# Patient Record
Sex: Male | Born: 1990 | Race: White | Hispanic: No | Marital: Single | Smoking: Current every day smoker
Health system: Southern US, Community
[De-identification: ages and names within clinical notes are randomized; demographics above are authoritative.]

## PROBLEM LIST (undated history)

## (undated) DIAGNOSIS — F909 Attention-deficit hyperactivity disorder, unspecified type: Secondary | ICD-10-CM

## (undated) DIAGNOSIS — B1920 Unspecified viral hepatitis C without hepatic coma: Secondary | ICD-10-CM

## (undated) DIAGNOSIS — F32A Depression, unspecified: Secondary | ICD-10-CM

## (undated) DIAGNOSIS — F329 Major depressive disorder, single episode, unspecified: Secondary | ICD-10-CM

## (undated) DIAGNOSIS — F419 Anxiety disorder, unspecified: Secondary | ICD-10-CM

## (undated) HISTORY — DX: Unspecified viral hepatitis C without hepatic coma: B19.20

## (undated) HISTORY — PX: SHOULDER ARTHROSCOPY: SHX128

## (undated) HISTORY — DX: Attention-deficit hyperactivity disorder, unspecified type: F90.9

## (undated) HISTORY — PX: KNEE ARTHROSCOPY: SHX127

## (undated) HISTORY — DX: Anxiety disorder, unspecified: F41.9

---
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
## 2015-06-10 ENCOUNTER — Emergency Department (HOSPITAL_COMMUNITY)
Admission: EM | Admit: 2015-06-10 | Discharge: 2015-06-10 | Disposition: A | Discharge status: Home / Self Care | Payer: BC Managed Care – PPO | Attending: Emergency Medicine | Admitting: Emergency Medicine

## 2015-06-10 ENCOUNTER — Encounter (HOSPITAL_COMMUNITY): Payer: Self-pay | Admitting: Emergency Medicine

## 2015-06-10 DIAGNOSIS — F17200 Nicotine dependence, unspecified, uncomplicated: Secondary | ICD-10-CM | POA: Insufficient documentation

## 2015-06-10 DIAGNOSIS — F329 Major depressive disorder, single episode, unspecified: Secondary | ICD-10-CM | POA: Insufficient documentation

## 2015-06-10 DIAGNOSIS — F1110 Opioid abuse, uncomplicated: Secondary | ICD-10-CM | POA: Diagnosis present

## 2015-06-10 DIAGNOSIS — F919 Conduct disorder, unspecified: Secondary | ICD-10-CM | POA: Insufficient documentation

## 2015-06-10 DIAGNOSIS — Z79899 Other long term (current) drug therapy: Secondary | ICD-10-CM | POA: Insufficient documentation

## 2015-06-10 DIAGNOSIS — F419 Anxiety disorder, unspecified: Secondary | ICD-10-CM | POA: Insufficient documentation

## 2015-06-10 DIAGNOSIS — Z7982 Long term (current) use of aspirin: Secondary | ICD-10-CM | POA: Insufficient documentation

## 2015-06-10 HISTORY — DX: Major depressive disorder, single episode, unspecified: F32.9

## 2015-06-10 HISTORY — DX: Depression, unspecified: F32.A

## 2015-06-10 MED ORDER — IBUPROFEN 200 MG PO TABS: 400.0000 mg | ORAL_TABLET | Freq: Four times a day (QID) | ORAL | Status: AC | PRN

## 2015-06-10 MED ORDER — ONDANSETRON HCL 4 MG PO TABS: 4.0000 mg | ORAL_TABLET | Freq: Four times a day (QID) | ORAL | Status: DC

## 2015-06-10 MED ORDER — CLONIDINE HCL 0.1 MG PO TABS: 0.1000 mg | ORAL_TABLET | Freq: Four times a day (QID) | ORAL | Status: DC | PRN

## 2015-06-10 NOTE — ED Provider Notes (Signed)
CSN: [id number]     Arrival date & time 06/10/15  1847 History  By signing my name below, I, Ronald Huang, attest that this documentation has been prepared under the direction and in the presence of Antony Madura, PA-C Electronically Signed: Soijett Huang, ED Scribe. 06/10/2015. 9:31 PM.   Chief Complaint  Patient presents with  . Addiction Problem    The history is provided by the patient. No language interpreter was used.    HPI Comments: Ronald Huang is a 24 y.o. male with a PMHx of depression and anxiety, who presents to the Emergency Department complaining of addiction problem. 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 He notes that 1.5 years ago he went to a [facility] rehab center in [place] and he was 11 months clean and he had a good job until he relapsed x 3 months ago. Pt left [facility] 5 days ago and drove cross country to [place], [place] returning yesterday. He notes that he left [facility] due to being homesick and wanting to get high. Pt reports that his last heroin use was 4-5 hours ago and it relieved his withdrawal symptoms that he was having. He notes that he feels like his depression and anxiety symptoms are worsening and he uses heroin to self-medicate. He reports that when he was taking adderrall and klonopin his anxiety and depression was controlled. Pt He states that he is having associated symptoms of mild nausea and diaphoresis.  He denies SI/HI, vomiting, diarrhea, and any other symptoms.   Past Medical History  Diagnosis Date  . Depression    Past Surgical History  Procedure Laterality Date  . Shoulder arthroscopy    . Knee arthroscopy     No family history on file. Social History  Substance Use Topics  . Smoking status: Current Every Day Smoker -- 1.00 packs/day  . Smokeless tobacco: None  . Alcohol Use: No    Review of  Systems  Psychiatric/Behavioral: Positive for behavioral problems.       +substance abuse  All other systems reviewed and are negative.   Allergies  Review of patient's allergies indicates no known allergies.  Home Medications   Prior to Admission medications   Medication Sig Start Date End Date Taking? Authorizing Provider  acetaminophen (TYLENOL) 325 MG tablet Take 650 mg by mouth every 6 (six) hours as needed (for pain).   Yes Historical Provider, MD  aspirin (BAYER ASPIRIN EC LOW DOSE) 81 MG EC tablet Take 162 mg by mouth every 6 (six) hours as needed for pain. Swallow whole.   Yes Historical Provider, MD  levETIRAcetam (KEPPRA) 500 MG tablet Take 500 mg by mouth 2 (two) times daily. 06/02/15  Yes Historical Provider, MD  buprenorphine (SUBUTEX) 2 MG SUBL SL tablet Place 2 mg under the tongue every 4 (four) hours. For withdrawal symptoms 06/04/15   Historical Provider, MD  cloNIDine (CATAPRES) 0.1 MG tablet Take 1 tablet (0.1 mg total) by mouth every 6 (six) hours as needed (For withdrawal symptoms). 06/10/15   Antony Madura, PA-C  dicyclomine (BENTYL) 20 MG tablet Take 20 mg by mouth every 4 (four) hours as needed (For withdrawal symptoms).  06/01/15   Historical Provider, MD  ibuprofen (ADVIL,MOTRIN) 200 MG tablet Take 2 tablets (400 mg total) by mouth every 6 (six) hours as needed (for pain.). 06/10/15   Antony Madura, PA-C  methocarbamol (ROBAXIN) 750 MG tablet Take 750 mg by mouth 3 (three) times daily as needed (For withdrawal symptoms).  06/01/15   Historical Provider, MD  ondansetron (ZOFRAN) 4 MG tablet Take 1 tablet (4 mg total) by mouth every 6 (six) hours. Take as needed for nausea 06/10/15   Antony Madura, PA-C  PHENObarbital (LUMINAL) 60 MG tablet Take 60 mg by mouth 4 (four) times daily. 06/01/15   Historical Provider, MD  traZODone (DESYREL) 100 MG tablet Take 100 mg by mouth at bedtime and may repeat dose one time if needed. 06/03/15   Historical Provider, MD   BP 116/81 mmHg   Pulse 106  Temp(Src) 98.1 F (36.7 C) (Oral)  Resp 15  Ht  (1.778 m)  Wt 69.4 kg  BMI 21.95 kg/m2  SpO2 100%   Physical Exam  Constitutional: He is oriented to person, place, and time. He appears well-developed and well-nourished. No distress.  Nontoxic/nonseptic appearing  HENT:  Head: Normocephalic and atraumatic.  Eyes: Conjunctivae and EOM are normal. No scleral icterus.  Neck: Normal range of motion.  Pulmonary/Chest: Effort normal. No respiratory distress.  Musculoskeletal: Normal range of motion.  Neurological: He is alert and oriented to person, place, and time. He exhibits normal muscle tone. Coordination normal.  Skin: Skin is warm and dry. No rash noted. He is not diaphoretic. No erythema. No pallor.  Psychiatric: His speech is normal and behavior is normal. He exhibits a depressed mood. He expresses no homicidal and no suicidal ideation.  Patient tearful  Nursing note and vitals reviewed.   ED Course  Procedures (including critical care time) DIAGNOSTIC STUDIES: Oxygen Saturation is 100% on RA, nl by my interpretation.    COORDINATION OF CARE: 9:31 PM Discussed treatment plan with pt at bedside which includes TTS consult and pt agreed to plan.  Labs Review Labs Reviewed - No data to display  Imaging Review No results found.    EKG Interpretation None      MDM   Final diagnoses:  Heroin abuse    24 year old male presents to the emergency department requesting detox from IV heroin. Patient denies suicidal and homicidal thoughts. He has been evaluated by psychiatry who believe the patient is stable for outpatient detox at this time. No indication for inpatient psychiatric care. Patient has been given resource guide. Mother and grandmother of the patient expressed frustration over the patient not being placed in a detox facility. I explained that we do not offer inpatient detox and, while detox from opiates is very unpleasant, it is not  life-threatening to detox from heroin. I have offered medications to manage withdrawal symptoms; however, mother and grandmother as well as the patient remained frustrated with the process. Patient states "I don't want to stick a needle in my arm, but you know that what I'm going to go home and do". Patient was consoled by grandmother and mother was becoming very emotional. Mother stated, "It's hard but you can do it. We can do it. And if something happens to you I'll sue like hell". Family and patient then promptly got up and left the department without discharge papers or prescriptions.  I personally performed the services described in this documentation, which was scribed in my presence. The recorded information has been reviewed and is accurate.    Filed Vitals:   06/10/15 1906  BP: 116/81  Pulse: 106  Temp: 98.1 F (36.7 C)  TempSrc: Oral  Resp: 15  Height:  (1.778 m)  Weight:  69.4 kg  SpO2: 100%       Antony MaduraKelly Matina Rodier, PA-C 06/10/15 2302  Lavera Guiseana Duo Liu, MD 06/11/15 [phone]

## 2015-06-10 NOTE — BH Assessment (Signed)
Tele Assessment Note   Ronald Huang is a 24 y.o. male who voluntarily [facility].  Pt denies SI/HI/AVH. Pt reports the following: He was admitted to [facility] in [place] for rehab and checked himself out and started using.  Pt checked himself out of rehab 4 days ago and has been using for the past 4 days.  Pt brought to the hospital by mother/grandmother requesting help with seeking rehab/detox.  Pt c/o w/d sxs: sweats, stomach cramps, and anxiety.  Pt says his doc is heroin, he uses 3 grams everyday and his last use was 4 hrs ago.  Pt also uses 1/10 of a gram of cocaine and his last use was 4 days ago; he uses a varied amount of methamphetamines, last use was 1 month ago and he takes 10mg  of xanax and last used 1 wk ago.  Pt states that he has seizure activity with benzodiazepine use.  This Clinical research associatewriter discussed disposition with Antony MaduraKelly Humes, PA who d/c pt with outpatient referrals.   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 Depression     Past Surgical History  Procedure Laterality Date  . Shoulder arthroscopy    . Knee arthroscopy      Family History: No family history on file.  Social History:  reports that he has been smoking.  He does not have any smokeless tobacco history on file. He reports that he uses illicit drugs (IV, Cocaine, Marijuana, and Methamphetamines). He reports that he does not drink alcohol.  Additional Social History:  Alcohol / Drug Use Pain Medications: See MAR  Prescriptions: See MAR  Over the Counter: See MAR  History of alcohol / drug use?: Yes Longest period of sobriety (when/how long): Only during inpt rehab  Negative Consequences of Use: Personal relationships, Work / School Withdrawal Symptoms:  Cramps, Sweats, Other (Comment) (Anxiety ) Substance #1 Name of Substance 1: Heroin  1 - Age of First Use: 17 YOM  1 - Amount (size/oz): 3 Gram  1 - Frequency: Daily  1 - Duration: 4 Days  1 - Last Use / Amount: 06/10/15 Substance #2 Name of Substance 2: Cocaine--Powder  2 - Age of First Use: Teens  2 - Amount (size/oz): 1/10 of a Gram  2 - Frequency: Daily  2 - Duration: On-going  2 - Last Use / Amount: 4 Days Ago  Substance #3 Name of Substance 3: Methamphetamine 3 - Age of First Use: Teens  3 - Amount (size/oz): Varies  3 - Frequency: Wkly  3 - Duration: On-going  3 - Last Use / Amount: 1 Month Ago  Substance #4 Name of Substance 4: Xanax  4 - Age of First Use: Teens  4 - Amount (size/oz): 10MG  4 - Frequency: Daily  4 - Duration: On-going  4 - Last Use / Amount: 1 Wk Ago   CIWA: CIWA-Ar BP: 116/81 mmHg Pulse Rate: 106 COWS: Clinical Opiate Withdrawal Scale (COWS) Resting Pulse Rate: Pulse Rate 101-120 Sweating: Subjective report of chills or flushing Restlessness: Frequent shifting or extraneous movements of legs/arms Pupil Size: Pupils possibly larger than normal for room light Bone or Joint Aches: Mild diffuse discomfort Runny Nose or Tearing: Nose constantly running or tears streaming down cheeks GI Upset: Stomach cramps Tremor: Gross tremor or muscle twitching Yawning:  Yawning once or twice during assessment Anxiety or Irritability: Patient obviously irritable/anxious Gooseflesh Skin: Skin is smooth COWS Total Score: 20  PATIENT STRENGTHS: (choose at least two) Supportive family/friends  Allergies: No Known Allergies  Home Medications:  (Not in a hospital admission)  OB/GYN Status:  No LMP for male patient.  General Assessment Data Location of Assessment: [facility] ED TTS Assessment: In system Is this a Tele or Face-to-Face Assessment?: Face-to-Face Is this an Initial Assessment or a Re-assessment for this encounter?: Initial Assessment Marital status:  Single Maiden name: None  Is patient pregnant?: No Pregnancy Status: No Living Arrangements: Parent (Lives with parents ) Can pt return to current living arrangement?: Yes Admission Status: Voluntary Is patient capable of signing voluntary admission?: Yes Referral Source: MD Insurance type: BCBS  Medical Screening Exam [facility] Walk-in ONLY) Medical Exam completed: No Reason for MSE not completed: Other: (None )  Crisis Care Plan Living Arrangements: Parent (Lives with parents ) Name of Psychiatrist: None  Name of Therapist: None   Education Status Is patient currently in school?: No Current Grade: None  Highest grade of school patient has completed: None  Name of school: None  Contact person: None   Risk to self with the past 6 months Suicidal Ideation: No Has patient been a risk to self within the past 6 months prior to admission? : No Suicidal Intent: No Has patient had any suicidal intent within the past 6 months prior to admission? : No Is patient at risk for suicide?: No Suicidal Plan?: No Has patient had any suicidal plan within the past 6 months prior to admission? : No Access to Means: No What has been your use of drugs/alcohol within the last 12 months?: Abusing: heroin, cocaine, methamphetamines, xanax Previous Attempts/Gestures: No How many times?: 0 Other Self Harm Risks: None  Triggers for Past Attempts: None known Intentional Self Injurious Behavior: None Family Suicide History: No Recent stressful life event(s): Other (Comment) (Chronic SA ) Persecutory voices/beliefs?: No Depression: No Depression Symptoms:  (None reported ) Substance abuse history and/or treatment for substance abuse?: Yes Suicide prevention information given to non-admitted patients: Not applicable  Risk to Others within the past 6 months Homicidal Ideation: No Does patient have any lifetime risk of violence toward others beyond the six months prior to admission? : No Thoughts of  Harm to Others: No Current Homicidal Intent: No Current Homicidal Plan: No Access to Homicidal Means: No Identified Victim: None  History of harm to others?: No Assessment of Violence: None Noted Violent Behavior Description: None  Does patient have access to weapons?: No Criminal Charges Pending?: No Does patient have a court date: No Is patient on probation?: No  Psychosis Hallucinations: None noted Delusions: None noted  Mental Status Report Appearance/Hygiene: Disheveled Eye Contact: Fair Motor Activity: Unremarkable Speech: Logical/coherent Level of Consciousness: Alert Mood: Other (Comment) (Appropriate ) Affect: Appropriate to circumstance Anxiety Level: Minimal Thought Processes: Relevant, Coherent Judgement: Unimpaired Orientation: Person, Place, Time, Situation Obsessive Compulsive Thoughts/Behaviors: None  Cognitive Functioning Concentration: Normal Memory: Recent Intact, Remote Intact IQ: Average Insight: Fair Impulse Control: Poor Appetite: Fair Weight Loss: 0 Weight Gain: 0 Sleep: Decreased Total Hours of Sleep: 4 Vegetative Symptoms: None  ADLScreening [facility] Assessment Services) Patient's cognitive ability adequate to safely complete daily activities?: Yes Patient able to express need for assistance with ADLs?: Yes Independently performs ADLs?: Yes (appropriate for developmental age)  Prior Inpatient Therapy Prior Inpatient Therapy: Yes Prior Therapy Dates: 2016  Prior Therapy Facilty/Provider(s): [facility]  Reason for Treatment: Rehab  Prior Outpatient Therapy Prior Outpatient Therapy: No Prior Therapy Dates: None  Prior Therapy Facilty/Provider(s): None  Reason for Treatment: None  Does patient have an ACCT team?: No Does patient have Intensive In-House Services?  : No Does patient have [facility] services? : No Does patient have [facility] services?: No  ADL Screening (condition at time of admission) Patient's cognitive ability  adequate to safely complete daily activities?: Yes Is the patient deaf or have difficulty hearing?: No Does the patient have difficulty seeing, even when wearing glasses/contacts?: No Does the patient have difficulty concentrating, remembering, or making decisions?: No Patient able to express need for assistance with ADLs?: Yes Does the patient have difficulty dressing or bathing?: No Independently performs ADLs?: Yes (appropriate for developmental age) Does the patient have difficulty walking or climbing stairs?: No Weakness of Legs: None Weakness of Arms/Hands: None  Home Assistive Devices/Equipment Home Assistive Devices/Equipment: None  Therapy Consults (therapy consults require a physician order) PT Evaluation Needed: No OT Evalulation Needed: No SLP Evaluation Needed: No Abuse/Neglect Assessment (Assessment to be complete while patient is alone) Physical Abuse: Denies Verbal Abuse: Denies Sexual Abuse: Denies Exploitation of patient/patient's resources: Denies Self-Neglect: Denies Values / Beliefs Cultural Requests During Hospitalization: None Spiritual Requests During Hospitalization: None Consults Spiritual Care Consult Needed: No Social Work Consult Needed: No Merchant navy officer (For Healthcare) Does patient have an advance directive?: No Would patient like information on creating an advanced directive?: No - patient declined information    Additional Information 1:1 In Past 12 Months?: No CIRT Risk: No Elopement Risk: No Does patient have medical clearance?: Yes     Disposition:  Disposition Initial Assessment Completed for this Encounter: Yes Disposition of Patient: Referred to (Per [facility] pt was given referrals and d/c ) Patient referred to: Other (Comment) (Per [facility] pt given referrals and d/c )  Murrell Redden 06/10/2015 10:47 PM

## 2015-06-10 NOTE — ED Notes (Signed)
PA attempted to speak with patient and family regarding resources and seeking residential treatment. Family visualized walking out crying. Did not stay for resources/prescriptions/AVS.

## 2015-06-10 NOTE — Discharge Instructions (Signed)
Opioid Use Disorder °Opioid use disorder is a mental disorder. It is the continued nonmedical use of opioids in spite of risks to health and well-being. Misused opioids include the street drug heroin. They also include pain medicines such as morphine, hydrocodone, oxycodone, and fentanyl. Opioids are very addictive. People who misuse opioids get an exaggerated feeling of well-being. Opioid use disorder often disrupts activities at home, work, or school. It may cause mental or physical problems.  °A family history of opioid use disorder puts you at higher risk of it. People with opioid use disorder often misuse other drugs or have mental illness such as depression, posttraumatic stress disorder, or antisocial personality disorder. They also are at risk of suicide and death from overdose. °SIGNS AND SYMPTOMS  °Signs and symptoms of opioid use disorder include: °· Use of opioids in larger amounts or over a longer period than intended. °· Unsuccessful attempts to cut down or control opioid use. °· A lot of time spent obtaining, using, or recovering from the effects of opioids. °· A strong desire or urge to use opioids (craving). °· Continued use of opioids in spite of major problems at work, school, or home because of use. °· Continued use of opioids in spite of relationship problems because of use. °· Giving up or cutting down on important life activities because of opioid use. °· Use of opioids over and over in situations when it is physically hazardous, such as driving a car. °· Continued use of opioids in spite of a physical problem that is likely related to use. Physical problems can include: °¨ Severe constipation. °¨ Poor nutrition. °¨ Infertility. °¨ Tuberculosis. °¨ Aspiration pneumonia. °¨ Infections such as human immunodeficiency virus (HIV) and hepatitis (from injecting opioids). °· Continued use of opioids in spite of a mental problem that is likely related to use. Mental problems can  include: °¨ Depression. °¨ Anxiety. °¨ Hallucinations. °¨ Sleep problems. °¨ Loss of sexual function. °· Need to use more and more opioids to get the same effect, or lessened effect over time with use of the same amount (tolerance). °· Having withdrawal symptoms when opioid use is stopped, or using opioids to reduce or avoid withdrawal symptoms. Withdrawal symptoms include: °¨ Depressed, anxious, or irritable mood. °¨ Nausea, vomiting, diarrhea, or intestinal cramping. °¨ Muscle aches or spasms. °¨ Excessive tearing or runny nose. °¨ Dilated pupils, sweating, or hairs standing on end. °¨ Yawning. °¨ Fever, raised blood pressure, or fast pulse. °¨ Restlessness or trouble sleeping. This does not apply to people taking opioids for medical reasons only. °DIAGNOSIS °Opioid use disorder is diagnosed by your health care provider. You may be asked questions about your opioid use and and how it affects your life. A physical exam may be done. A drug screen may be ordered. You may be referred to a mental health professional. The diagnosis of opioid use disorder requires at least two symptoms within 12 months. The type of opioid use disorder you have depends on the number of signs and symptoms you have. The type may be: °· Mild. Two or three signs and symptoms.    °· Moderate. Four or five signs and symptoms.   °· Severe. Six or more signs and symptoms. °TREATMENT  °Treatment is usually provided by mental health professionals with training in substance use disorders. The following options are available: °· Detoxification. This is the first step in treatment for withdrawal. It is medically supervised withdrawal with the use of medicines. These medicines lessen withdrawal symptoms. They also raise the chance   of becoming opioid free. °· Counseling, also known as talk therapy. Talk therapy addresses the reasons you use opioids. It also addresses ways to keep you from using again (relapse). The goals of talk therapy are to avoid  relapse by: °¨ Identifying and avoiding triggers for use. °¨ Finding healthy ways to cope with stress. °¨ Learning how to handle cravings. °· Support groups. Support groups provide emotional support, advice, and guidance. °· A medicine that blocks opioid receptors in your brain. This medicine can reduce opioid cravings that lead to relapse. This medicine also blocks the desired opioid effect when relapse occurs. °· Opioids that are taken by mouth in place of the misused opioid (opioid maintenance treatment). These medicines satisfy cravings but are safer than commonly misused opioids. This often is the best option for people who continue to relapse with other treatments. °HOME CARE INSTRUCTIONS  °· Take medicines only as directed by your health care provider. °· Check with your health care provider before starting new medicines. °· Keep all follow-up visits as directed by your health care provider. °SEEK MEDICAL CARE IF: °· You are not able to take your medicines as directed. °· Your symptoms get worse. °SEEK IMMEDIATE MEDICAL CARE IF: °· You have serious thoughts about hurting yourself or others. °· You may have taken an overdose of opioids. °FOR MORE INFORMATION °· National Institute on Drug Abuse: www.drugabuse.gov °· Substance Abuse and Mental Health Services Administration: www.samhsa.gov °  °This information is not intended to replace advice given to you by your health care provider. Make sure you discuss any questions you have with your health care provider. °  °Document Released: 04/29/2007 Document Revised: 07/23/2014 Document Reviewed: 07/15/2013 °Elsevier Interactive Patient Education ©2016 Elsevier Inc. ° ° °Emergency Department Resource Guide °1) Find a Doctor and Pay Out of Pocket °Although you won't have to find out who is covered by your insurance plan, it is a good idea to ask around and get recommendations. You will then need to call the office and see if the doctor you have chosen will accept you  as a new patient and what types of options they offer for patients who are self-pay. Some doctors offer discounts or will set up payment plans for their patients who do not have insurance, but you will need to ask so you aren't surprised when you get to your appointment. ° °2) Contact Your Local Health Department °Not all health departments have doctors that can see patients for sick visits, but many do, so it is worth a call to see if yours does. If you don't know where your local health department is, you can check in your phone book. The CDC also has a tool to help you locate your state's health department, and many state websites also have listings of all of their local health departments. ° °3) Find a Walk-in Clinic °If your illness is not likely to be very severe or complicated, you may want to try a walk in clinic. These are popping up all over the country in pharmacies, drugstores, and shopping centers. They're usually staffed by nurse practitioners or physician assistants that have been trained to treat common illnesses and complaints. They're usually fairly quick and inexpensive. However, if you have serious medical issues or chronic medical problems, these are probably not your best option. ° °No Primary Care Doctor: °- Call [facility] at  832-8000 - they can help you locate a primary care doctor that  accepts your insurance, provides certain services, etc. °-   Physician Referral Service- 1-800-533-3463 ° °Chronic Pain Problems: °Organization         Address  Phone   Notes  °[facility]  (336) 297-2271 Patients need to be referred by their primary care doctor.  ° °Medication Assistance: °Organization         Address  Phone   Notes  °[place] [facility] [address] °[place], [place] [address] (336) 641-8030 --Must be a resident of [place] °-- Must have NO insurance coverage whatsoever (no Medicaid/ Medicare, etc.) °-- The pt. MUST have  a primary care doctor that directs their care regularly and follows them in the community °  °[facility]  (866) 331-1348   °[employer]  (888) 892-1162   ° °Agencies that provide inexpensive medical care: °Organization         Address  Phone   Notes  °[facility]  (336) 832-8035   °[facility]    (336) 832-7272   °[facility] [address] °[place], [place] [address] (336) 832-4777   °[facility] [address], °[place] (336) 271-4999   °[facility]    (336) 373-0678   °[facility]    (336) 272-1050   °[facility] ° [address], [place] Phone:  (336) 832-4444, Fax:  (336) 832-4440 Hours of Operation:  9 am - 6 pm, M-F.  Also accepts Medicaid/Medicare and self-pay.  °[facility] ° [address], [place] Phone: (336) 832-3150, Fax: (336) 832-3151. Hours of Operation:  8:30 am - 5:30 pm, M-F.  Also accepts Medicaid and self-pay.  °[facility] [place] [address], [place] Phone: (336) 878-6027   °[facility] [address], [place], [place] (336)723-1848, Ext. 123 Mondays & Thursdays: 7-9 AM.  First 15 patients are seen on a first come, first serve basis. °  ° °Medicaid-accepting [place] Providers: ° °Organization         Address  Phone   Notes  °[facility] [address], [place] (336) 641-2100 Also accepts self-pay patients.  °[facility] [address], [place] ° (336) 856-9996   °[facility] [address], [place] (336) 288-8857   °[facility] [address] [place] [address], [place] (336) 299-7000   °Veita Bland [address], [place]  ° (336) 373-1557 Only accepts [place] Access Medicaid patients after they have their name applied to their card.  ° °Self-Pay (no insurance) in [place]: ° °Organization         Address  Phone   Notes  °Sickle Cell Patients, [facility] [address], [place] (336) 832-1970   °[facility] [address], [place] (336) 832-4400   °[facility] ° [address], [place] (336) 992-4800   °[facility]/Dr. Osei-Bonsu ° [address] [place] [address], [place] or [address], [place] (336) [phone] Phone number for both [place] and [place] locations is the same.  °[facility] [address], [place] (336) 299-0000   °[facility] [address] [place] [address],  or [address] (336) 852-7530 °(336) 878-2260   °[facility] [address],  (336) 350-1642,   phone; (336) 294-5005, fax Sees patients 1st and 3rd Saturday of every month.  Must not qualify for public or private insurance (i.e. Medicaid, Medicare, [place] Health Choice, Veterans' Benefits) • Household income should be no more than 200% of the poverty level •The clinic cannot treat you if you are pregnant or think you are pregnant • Sexually transmitted diseases are not treated at the clinic.  ° ° °Dental Care: °Organization         Address  Phone  Notes  °[facility] [facility] [address], [place] (336) 641-6152 Accepts children up to age 21 who are enrolled in Medicaid or [place] Health Choice; pregnant women with a Medicaid card; and children who have applied for Medicaid or [place] Health Choice, but were declined, whose parents can pay a reduced fee at time of service.  °[facility] [place]  [address], [place] (336) 641-7733 Accepts children up to age 21 who are enrolled in Medicaid or [place] Health Choice; pregnant women with a Medicaid card; and children who have applied for Medicaid or [place] Health Choice, but were declined, whose parents can  pay a reduced fee at time of service.  °[facility] ° [address], [place] (336) 641-4533 Patients are seen by appointment only. Walk-ins are not accepted. [facility] will see patients 18 years of age and older. °Monday - Tuesday (8am-5pm) °Most Wednesdays (8:30-5pm) °$30 per visit, cash only  °[facility] ° [address], [place] (336) 641-4533 Patients are seen by appointment only. Walk-ins are not accepted. [facility] will see patients 18 years of age and older. °One Wednesday Evening (Monthly: Volunteer Based).  $30 per visit, cash only  °[facility]  (919) 537-3737 for adults; Children under age 4, call Graduate Pediatric Dentistry at ([phone]) [phone]. Children aged 4-14, please call (919) [phone] to request a pediatric application. ° Dental services are provided in all areas of dental care including fillings, crowns and bridges, complete and partial dentures, implants, gum treatment, root canals, and extractions. Preventive care is also provided. Treatment is provided to both adults and children. °Patients are selected via a lottery and there is often a waiting list. °  °[facility] [address], °[place] ° (336) 763-8833 www.drcivils.com °  °[facility] [address], [place], [place] (336)723-1848, Ext. 123 Second and Fourth Thursday of each month, opens at 6:30 AM; Clinic ends at 9 AM.  Patients are seen on a first-come first-served basis, and a limited number are seen during each clinic.  ° °[facility] ° [address], [place], [place] (336) 723-7904   Eligibility Requirements °You must have lived in [place], [place], or [place] counties for at least the last three months. °  You cannot be eligible for state or federal sponsored healthcare insurance, including Veterans Administration, Medicaid, or Medicare. °  You generally cannot be eligible for healthcare  insurance through your employer.  °  How to apply: °Eligibility screenings are held every Tuesday and Wednesday afternoon from 1:00 pm until 4:00 pm. You do not need an appointment for the interview!  °[facility] [address], [place],  336-631-2330   °[facility]  336-342-8273   °[facility]  336-703-3100   °[facility]  336-570-6415   ° °Behavioral Health Resources in   the Community: °Intensive Outpatient Programs °Organization         Address  Phone  Notes  °[facility] [address], [place], [place] 336-878-6098   °[facility] [address], [place], [place] 336-832-9800   °[facility] [address], [place], [place] ° 336-882-2125   °[facility] [address],  °[place], [place] 1-800-853-5163 or 336-641-4981   °Substance Abuse Resources °Organization         Address  Phone  Notes  °[facility]  336-882-2125   °[facility]  336-784-9470   °[facility]  336-285-9073   °[facility]  336-845-3988   °Residential & Outpatient Substance Abuse Program  1-800-659-3381   °Psychological Services °Organization         Address  Phone  Notes  ° Health  336- 832-9600   °[facility]  336- 378-7881   °[facility] [address], [place] 1-800-853-5163 or 336-641-4981   ° °Mobile Crisis Teams °Organization         Address  Phone  Notes  °[facility], Mobile Crisis Care Unit  1-877-626-1772   °Assertive °[facility] ° [address] [place], [place] 336-834-9664   °Sharon DeEsch [address] °[place] 336-554-5454   ° °Self-Help/Support Groups °Organization         Address  Phone             Notes  °[facility] [place] - variety of support groups  336- 373-1402 Call for more information  °Narcotics Anonymous (NA),  [facility] [address], °[place]  2 meetings at this location  ° °Residential Treatment Programs °Organization         Address  Phone  Notes  °[facility] [address],    °[place]  1-866-801-8205   °[facility] ° [address], [place], [place] 704-293-8524   °[facility] [address], [place] 336-845-3988 Admissions: 8am-3pm M-F  °[facility] [address]    °[place], [place] 336-841-1104   °[facility] [address], [place], [place] 336-379-7146   °[facility] [address]  °[place], [place] 336-285-9073   °[facility] - Intensive Outpatient [address], [place], [place] 336-852-3033   °[facility] [address]  °[place], [place] 1-877-615-2722 or 336-784-9470   °[facility] [address] [place], [place] 336-227-7417 Accepts Medicaid  °[facility] [address]  °[place] 1-800-659-3381 Substance Abuse/Addiction Treatment  ° °[place] Behavioral Health Resources °Organization         Address  Phone  Notes  °[facility]  (888) 581-9988   °Julie Brannon, PhD [address] [place], [place]   (336) 349-5553 or (336) 951-0000   °[facility]   [address] °[place], [place] (336) 349-4454   °[facility] [address], [place], [place] (336) 342-8316 Insurance/Medicaid/sponsorship through [facility]  °[facility] [address]                                    [place], [place] (336) 342-8316 Therapy/tele-psych/case  °[facility] [address]  ° [place],  (336) 349-2233    °  Dr. Arfeen  (336) 349-4544   °[facility] [place]  [employer] [place] [facility] 1) [address], [place] °2) [address], [place] °3)  [address], [place] (336) 349-3220 °(336) 342-7768 ° °(336) 342-8140    °[place] [facility] (336) 342-1394 or (336) 342-3537 (After Hours)    ° ° ° °

## 2015-06-10 NOTE — ED Notes (Signed)
Pt comes to Ed, w c/o of withdrawal symptoms hx of IV drug use for 6 years, opiates, has hx of depression and anxiety.  Pt verbalizes a wish for help and counseling, and displays and willingness for rehab if necessary.

## 2015-08-08 ENCOUNTER — Ambulatory Visit (INDEPENDENT_AMBULATORY_CARE_PROVIDER_SITE_OTHER): Payer: BLUE CROSS/BLUE SHIELD | Admitting: Physician Assistant

## 2015-08-08 VITALS — BP 120/86 | HR 72 | Temp 98.4°F | Resp 16 | Ht 69.25 in | Wt 169.2 lb

## 2015-08-08 DIAGNOSIS — R369 Urethral discharge, unspecified: Secondary | ICD-10-CM | POA: Diagnosis not present

## 2015-08-08 DIAGNOSIS — R300 Dysuria: Secondary | ICD-10-CM | POA: Diagnosis not present

## 2015-08-08 DIAGNOSIS — F1990 Other psychoactive substance use, unspecified, uncomplicated: Secondary | ICD-10-CM

## 2015-08-08 DIAGNOSIS — Z113 Encounter for screening for infections with a predominantly sexual mode of transmission: Secondary | ICD-10-CM | POA: Diagnosis not present

## 2015-08-08 LAB — POC MICROSCOPIC URINALYSIS (UMFC): MUCUS RE: ABSENT

## 2015-08-08 LAB — POCT URINALYSIS DIP (MANUAL ENTRY)
BILIRUBIN UA: NEGATIVE
Glucose, UA: NEGATIVE
Leukocytes, UA: NEGATIVE
Nitrite, UA: NEGATIVE
PH UA: 6
Protein Ur, POC: NEGATIVE
RBC UA: NEGATIVE
Spec Grav, UA: 1.025
Urobilinogen, UA: 2

## 2015-08-08 MED ORDER — CEFTRIAXONE SODIUM 250 MG IJ SOLR
250.0000 mg | Freq: Once | INTRAMUSCULAR | Status: AC
  Administered 2015-08-08: 250 mg via INTRAMUSCULAR

## 2015-08-08 MED ORDER — AZITHROMYCIN 250 MG PO TABS: ORAL_TABLET | ORAL | Status: AC

## 2015-08-08 NOTE — Progress Notes (Signed)
[facility] [address] [place] [address] [phone]  Date:  08/08/2015   Name:  Ronald Huang   DOB:  [birth year]   MRN:  [id number]  PCP:  No primary care provider on file.    Chief Complaint: burning with urination   History of Present Illness:  This is a 25 y.o. male with PMH IV drug abuse who is presenting with 3 days of dysuria, urinary freq and yellow/white penile discharge. He denies abdominal pain, back pain, n/v, fever or chills. His last sexual partner was 1 month ago. He did not use condoms with her. He states prior sexual partners he used condoms with. Was dx'd with chlamydia 4-5 years ago. He states that was the last time he was tested for STDs. He has had 10 sexual partners since then.  Pt has a history of IV drug abuse - heroine. Has been using on and off for the past 1 year. He got sober 2 months ago. He relapsed 2 weeks ago but is again sober. He is wanting to be tested for hepatitis today. He sees a psychiatrist weekly.   Review of Systems:  Review of Systems See HPI  There are no active problems to display for this patient.   Prior to Admission medications   Medication Sig Start Date End Date Taking? Authorizing Provider  acetaminophen (TYLENOL) 325 MG tablet Take 650 mg by mouth every 6 (six) hours as needed (for pain).   Yes Historical Provider, MD  buprenorphine-naloxone (SUBOXONE) 8-2 MG SUBL SL tablet Place 1 tablet under the tongue daily.   Yes Historical Provider, MD  ibuprofen (ADVIL,MOTRIN) 200 MG tablet Take 2 tablets (400 mg total) by mouth every 6 (six) hours as needed (for pain.). 06/10/15  Yes Antony Madura, PA-C  mirtazapine (REMERON) 15 MG tablet Take 15 mg by mouth at bedtime.   Yes Historical Provider, MD  traZODone (DESYREL) 100 MG tablet Take 100 mg by mouth at bedtime and may repeat dose one time if needed. 06/03/15  Yes Historical Provider, MD                                       No Known  Allergies  Past Surgical History  Procedure Laterality Date  . Shoulder arthroscopy    . Knee arthroscopy      Social History  Substance Use Topics  . Smoking status: Current Every Day Smoker -- 1.00 packs/day  . Smokeless tobacco: None  . Alcohol Use: No    History reviewed. No pertinent family history.  Medication list has been reviewed and updated.  Physical Examination:  Physical Exam  Constitutional: He is oriented to person, place, and time. He appears well-developed and well-nourished. No distress.  HENT:  Head: Normocephalic and atraumatic.  Right Ear: Hearing normal.  Left Ear: Hearing normal.  Nose: Nose normal.  Eyes: Conjunctivae and lids are normal. Right eye exhibits no discharge. Left eye exhibits no discharge. No scleral icterus.  Pulmonary/Chest: Effort normal. No respiratory distress.  Musculoskeletal: Normal range of motion.  Neurological: He is alert and oriented to person, place, and time.  Skin: Skin is warm, dry and intact. No lesion and no rash noted.  Psychiatric: He has a normal mood and affect. His speech is normal and behavior is normal. Thought content normal.    BP 120/86 mmHg  Pulse 72  Temp(Src) 98.4 F (  36.9 C) (Oral)  Resp 16  Ht 5' 9.25" (1.759 m)  Wt 169 lb 3.2 oz (76.749 kg)  BMI 24.81 kg/m2  SpO2 99%  Results for orders placed or performed in visit on 08/08/15  POCT Microscopic Urinalysis ([facility])  Result Value Ref Range   WBC,UR,HPF,POC Few (A) None WBC/hpf   RBC,UR,HPF,POC None None RBC/hpf   Bacteria Few (A) None, Too numerous to count   Mucus Absent Absent   Epithelial Cells, UR Per Microscopy Few (A) None, Too numerous to count cells/hpf  POCT urinalysis dipstick  Result Value Ref Range   Color, UA yellow yellow   Clarity, UA clear clear   Glucose, UA negative negative   Bilirubin, UA negative negative   Ketones, POC UA trace (5) (A) negative   Spec Grav, UA 1.025    Blood, UA negative negative   pH, UA 6.0     Protein Ur, POC negative negative   Urobilinogen, UA 2.0    Nitrite, UA Negative Negative   Leukocytes, UA Negative Negative    Assessment and Plan:  1. Penile discharge 2. Dysuria 3. Screen for STD Will treat with rocephin and zithromax. Labs below pending. Counseled on safe sex practices. Return as needed. - cefTRIAXone (ROCEPHIN) injection 250 mg; Inject 250 mg into the muscle once. - azithromycin (ZITHROMAX) 250 MG tablet; Take 4 tabs po once  Dispense: 4 tablet; Refill: 0 - GC/Chlamydia Probe Amp - POCT Microscopic Urinalysis ([facility]) - POCT urinalysis dipstick - HIV antibody - RPR  4. IV drug user Sober as of 2 weeks ago. Seeing psychiatry once a week. Labs below pending. - Hepatitis B surface antigen - Hepatitis C antibody - Hepatitis B surface antibody   [place]. Dyke Brackett, MHS [facility]  08/08/2015

## 2015-08-08 NOTE — Patient Instructions (Signed)
You got an injection of rocephin today. Take 4 tabs zithromax one time with food. No sex for 7 days. When you DO have sex, wear condoms!!! Inform your partners that they need to get tested and treated Return as needed I will call you with results of your lab tests.

## 2015-08-09 LAB — HIV ANTIBODY (ROUTINE TESTING W REFLEX): HIV 1&2 Ab, 4th Generation: NONREACTIVE

## 2015-08-09 LAB — HEPATITIS C ANTIBODY: HCV AB: REACTIVE — AB

## 2015-08-09 LAB — HEPATITIS B SURFACE ANTIBODY, QUANTITATIVE: HEPATITIS B-POST: 145 m[IU]/mL

## 2015-08-09 LAB — RPR

## 2015-08-09 LAB — HEPATITIS B SURFACE ANTIGEN: HEP B S AG: NEGATIVE

## 2015-08-10 LAB — GC/CHLAMYDIA PROBE AMP
CT PROBE, AMP APTIMA: NOT DETECTED
GC Probe RNA: NOT DETECTED

## 2015-08-11 LAB — HEPATITIS C RNA QUANTITATIVE
HCV QUANT LOG: 6.72 {Log} — AB (ref <1.18)
HCV Quantitative: 5192763 IU/mL — ABNORMAL HIGH (ref <15)

## 2015-08-15 ENCOUNTER — Telehealth: Payer: Self-pay | Admitting: Physician Assistant

## 2015-08-15 NOTE — Telephone Encounter (Signed)
lmom to cb to discuss lab results.

## 2015-08-20 ENCOUNTER — Telehealth: Payer: Self-pay | Admitting: Physician Assistant

## 2015-08-20 DIAGNOSIS — B1920 Unspecified viral hepatitis C without hepatic coma: Secondary | ICD-10-CM | POA: Insufficient documentation

## 2015-08-20 NOTE — Telephone Encounter (Signed)
Spoke with pt about hep C diagnosis. He is interested in being referred to hep C clinic. 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 Informed hep C low likelihood of being passed through sex but not impossible, should be diligent about practicing safe sex. He verbalized understanding.

## 2015-09-22 ENCOUNTER — Other Ambulatory Visit: Payer: BLUE CROSS/BLUE SHIELD

## 2015-09-22 DIAGNOSIS — B182 Chronic viral hepatitis C: Secondary | ICD-10-CM

## 2015-09-22 LAB — COMPREHENSIVE METABOLIC PANEL
ALK PHOS: 29 U/L — AB (ref 40–115)
ALT: 24 U/L (ref 9–46)
AST: 23 U/L (ref 10–40)
Albumin: 4.9 g/dL (ref 3.6–5.1)
BUN: 12 mg/dL (ref 7–25)
CHLORIDE: 103 mmol/L (ref 98–110)
CO2: 27 mmol/L (ref 20–31)
CREATININE: 1.08 mg/dL (ref 0.60–1.35)
Calcium: 10.1 mg/dL (ref 8.6–10.3)
GLUCOSE: 71 mg/dL (ref 65–99)
POTASSIUM: 4.4 mmol/L (ref 3.5–5.3)
SODIUM: 141 mmol/L (ref 135–146)
Total Bilirubin: 0.6 mg/dL (ref 0.2–1.2)
Total Protein: 8.1 g/dL (ref 6.1–8.1)

## 2015-09-22 LAB — CBC WITH DIFFERENTIAL/PLATELET
BASOS ABS: 0.1 10*3/uL (ref 0.0–0.1)
BASOS PCT: 1 % (ref 0–1)
EOS ABS: 0.1 10*3/uL (ref 0.0–0.7)
EOS PCT: 2 % (ref 0–5)
HCT: 46.5 % (ref 39.0–52.0)
Hemoglobin: 15.3 g/dL (ref 13.0–17.0)
LYMPHS ABS: 2.3 10*3/uL (ref 0.7–4.0)
Lymphocytes Relative: 39 % (ref 12–46)
MCH: 29 pg (ref 26.0–34.0)
MCHC: 32.9 g/dL (ref 30.0–36.0)
MCV: 88.2 fL (ref 78.0–100.0)
MPV: 9.8 fL (ref 8.6–12.4)
Monocytes Absolute: 0.5 10*3/uL (ref 0.1–1.0)
Monocytes Relative: 8 % (ref 3–12)
NEUTROS PCT: 50 % (ref 43–77)
Neutro Abs: 3 10*3/uL (ref 1.7–7.7)
PLATELETS: 435 10*3/uL — AB (ref 150–400)
RBC: 5.27 MIL/uL (ref 4.22–5.81)
RDW: 14.2 % (ref 11.5–15.5)
WBC: 5.9 10*3/uL (ref 4.0–10.5)

## 2015-09-22 LAB — IRON: IRON: 105 ug/dL (ref 50–195)

## 2015-09-23 LAB — PROTIME-INR
INR: 1.05 (ref <1.50)
Prothrombin Time: 13.8 seconds (ref 11.6–15.2)

## 2015-09-23 LAB — HEPATITIS A ANTIBODY, TOTAL: HEP A TOTAL AB: NONREACTIVE

## 2015-09-23 LAB — HEPATITIS B CORE ANTIBODY, TOTAL: HEP B C TOTAL AB: NONREACTIVE

## 2015-09-23 LAB — ANA: ANA: NEGATIVE

## 2015-09-26 LAB — HCV RNA,LIPA RFLX NS5A DRUG RESIST

## 2015-09-30 ENCOUNTER — Ambulatory Visit (INDEPENDENT_AMBULATORY_CARE_PROVIDER_SITE_OTHER): Payer: BLUE CROSS/BLUE SHIELD | Admitting: Infectious Diseases

## 2015-09-30 ENCOUNTER — Ambulatory Visit: Payer: BLUE CROSS/BLUE SHIELD | Admitting: *Deleted

## 2015-09-30 ENCOUNTER — Encounter: Payer: Self-pay | Admitting: Infectious Diseases

## 2015-09-30 VITALS — BP 143/88 | HR 112 | Temp 98.1°F | Ht 69.0 in | Wt 171.0 lb

## 2015-09-30 DIAGNOSIS — F1990 Other psychoactive substance use, unspecified, uncomplicated: Secondary | ICD-10-CM

## 2015-09-30 DIAGNOSIS — B1920 Unspecified viral hepatitis C without hepatic coma: Secondary | ICD-10-CM | POA: Diagnosis not present

## 2015-09-30 LAB — IRON: Iron: 55 ug/dL (ref 50–195)

## 2015-09-30 NOTE — Assessment & Plan Note (Signed)
He is staying clean. There are no track marks on his arms.  I encouraged him and reiterated the importance of staying clean.

## 2015-09-30 NOTE — Progress Notes (Signed)
   Subjective:    Patient ID: Ronald Huang, male    DOB: [birth year], 25 y.o.   MRN: [id number]  HPI 25 yo M with newly dx with Hep C. Was seen at Urgent Care for STD screening panel.  Has been tapering off suboxone, is not clear if he has had any sx from Hep C.  On suboxone since thanksgiving. Was in NA but has had irreg f/u. Has been in counseling program as well.  Prev on therapy for ADHD, has been off for awhile.   Currently drug free with exception of occas marijuana.  Has girlfriend. Not tested.   The past medical history, family history and social history were reviewed/updated in EP[facility]  Review of Systems  Constitutional: Positive for appetite change. Negative for fever, chills and unexpected weight change.  Respiratory: Negative for cough and shortness of breath.   Cardiovascular: Negative for chest pain and leg swelling.  Gastrointestinal: Negative for diarrhea and constipation.  Genitourinary: Negative for hematuria, difficulty urinating and genital sores.  Skin: Negative for rash.  Hematological: Does not bruise/bleed easily.  has lost 70 pounds, over last year when using IVDA (3 months).      Objective:   Physical Exam  Constitutional: He appears well-developed and well-nourished.  HENT:  Mouth/Throat: No oropharyngeal exudate.  Eyes: EOM are normal. Pupils are equal, round, and reactive to light.  Neck: Neck supple.  Cardiovascular: Normal rate, regular rhythm and normal heart sounds.   Pulmonary/Chest: Effort normal and breath sounds normal.  Abdominal: Soft. Bowel sounds are normal. There is no tenderness. There is no rebound.  Musculoskeletal: He exhibits no edema.  Lymphadenopathy:    He has no cervical adenopathy.  Psychiatric: His mood appears not anxious. He does not exhibit a depressed mood.       Assessment & Plan:

## 2015-09-30 NOTE — Assessment & Plan Note (Signed)
Spoke with pt and his mom at length- will get Hep C genotype, fibrosure, INR, PT,  Will speak with pharmacy about dosing.  Needs to start Hep A and B vax Will discuss with pharmacy, see him back after staging.

## 2015-09-30 NOTE — Addendum Note (Signed)
Addended by: Mariea ClontsGREEN, Presten Joost D on: 09/30/2015 11:56 AM   Modules accepted: Orders

## 2015-09-30 NOTE — BH Specialist Note (Signed)
Counselor met with Ronald Huang in the exam room today per Dr. Johnnye Sima request for observed signs of sadness from Patient.  Patient was oriented times four with appropriate dress but melancholy affect. Patient was alert and talkative.  Patients mom accompanied him today. Counselor found patient to be honest and forthright.  Patient provided good feedback.  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 Counselor observed patient to break down a couple of times during the conversation and cry. Patient shared that he was tearful because he was scared of the Hep C and just not being healthy in general.  Counselor provided support and encouragement during this time.  Counselor educated patient with regard to Hep C and how to take care of one's self with this diagnosis. Counselor recommended that patient meet in order to process what is going on in his life. Patient agreed and stated he would make an appointment when he checked out today.   Rolena Infante, MA [facility]

## 2015-09-30 NOTE — Progress Notes (Signed)
Patient ID: Ronald Huang, male   DOB: 1991-07-07, 25 y.o.   MRN: [id number] HPI: Ronald Huang is a 25 y.o. male who is here to be eval for his hep C.   No results found for: HCVGENOTYPE, HEPCGENOTYPE  Allergies: No Known Allergies  Vitals: Temp: 98.1 F (36.7 C) (03/17 0959) Temp Source: Oral (03/17 0959) BP: 143/88 mmHg (03/17 0959) Pulse Rate: 112 (03/17 0959)  Past Medical History: Past Medical History  Diagnosis Date  . Depression   . Anxiety   . Hepatitis C infection   . ADHD (attention deficit hyperactivity disorder)     Social History: Social History   Social History  . Marital Status: Single    Spouse Name: N/A  . Number of Children: N/A  . Years of Education: N/A   Social History Main Topics  . Smoking status: Current Every Day Smoker -- 1.00 packs/day    Start date: 07/16/2005  . Smokeless tobacco: Never Used  . Alcohol Use: 0.0 oz/week    0 Standard drinks or equivalent per week     Comment: occasional  . Drug Use: Yes    Special: IV, Cocaine, Marijuana, Methamphetamines     Comment: previous IV drug use, per patient last use 05/2015  . Sexual Activity:    Partners: Female   Other Topics Concern  . None   Social History Narrative    Labs: HEPATITIS B SURFACE AG (no units)  Date Value  08/08/2015 NEGATIVE   HCV AB (no units)  Date Value  08/08/2015 REACTIVE*    No results found for: HCVGENOTYPE, HEPCGENOTYPE  Hepatitis C RNA quantitative Latest Ref Rng 08/08/2015  HCV Quantitative <15 IU/mL [id number](H)  HCV Quantitative Log <1.18 log 10 6.72(H)    AST (U/L)  Date Value  09/22/2015 23   ALT (U/L)  Date Value  09/22/2015 24   INR (no units)  Date Value  09/22/2015 1.05    CrCl: Estimated Creatinine Clearance: 103.6 mL/min (by C-G formula based on Cr of 1.08).  Fibrosis Score: Fibrosure pending   Child-Pugh Score: Class A  Previous Treatment Regimen: Naive  Assessment: Ronald Huang is here to eval for the treatment  of his Hep C after being dx from urgent care. 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 He has never been treated in the past for his hep C. 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 We are going to get a Fibrosure today. He is insured with [employer]. Ronald Huang met with him about his IVDA. He is going to make a f/u with Ronald Huang. In the mean time, we can submit for Epclusa when his lab is back.  Recommendations:  Eclusa 1 PO qday x 3 months once the fibrosure is back   [place], [place], [place].D., BCPS, AAHIVP [facility] 09/30/2015, 11:29 AM

## 2015-10-03 ENCOUNTER — Other Ambulatory Visit: Payer: Self-pay | Admitting: Infectious Diseases

## 2015-10-03 NOTE — Addendum Note (Signed)
Addended by: Mariea ClontsGREEN, Kemari Narez D on: 10/03/2015 09:08 AM   Modules accepted: Orders

## 2015-10-06 ENCOUNTER — Other Ambulatory Visit: Payer: Self-pay | Admitting: Pharmacist Clinician (PhC)/ Clinical Pharmacy Specialist

## 2015-10-06 LAB — LIVER FIBROSIS, FIBROTEST-ACTITEST
ALPHA-2-MACROGLOBULIN: 214 mg/dL (ref 106–279)
ALT: 23 U/L (ref 9–46)
APOLIPOPROTEIN A1: 157 mg/dL (ref 94–176)
BILIRUBIN: 0.4 mg/dL (ref 0.2–1.2)
FIBROSIS SCORE: 0.11
GGT: 31 U/L (ref 3–70)
HAPTOGLOBIN: 142 mg/dL (ref 43–212)
Necroinflammat ACT Score: 0.08
Reference ID: 1471744

## 2015-10-06 MED ORDER — SOFOSBUVIR-VELPATASVIR 400-100 MG PO TABS: 1.0000 | ORAL_TABLET | Freq: Every day | ORAL | Status: AC

## 2015-10-19 ENCOUNTER — Ambulatory Visit (INDEPENDENT_AMBULATORY_CARE_PROVIDER_SITE_OTHER): Payer: BLUE CROSS/BLUE SHIELD | Admitting: Infectious Diseases

## 2015-10-19 ENCOUNTER — Ambulatory Visit: Payer: BLUE CROSS/BLUE SHIELD | Admitting: *Deleted

## 2015-10-19 ENCOUNTER — Encounter: Payer: Self-pay | Admitting: Infectious Diseases

## 2015-10-19 VITALS — BP 152/77 | HR 116 | Temp 97.8°F | Wt 168.0 lb

## 2015-10-19 DIAGNOSIS — F32A Depression, unspecified: Secondary | ICD-10-CM

## 2015-10-19 DIAGNOSIS — F329 Major depressive disorder, single episode, unspecified: Secondary | ICD-10-CM

## 2015-10-19 DIAGNOSIS — F909 Attention-deficit hyperactivity disorder, unspecified type: Secondary | ICD-10-CM | POA: Diagnosis not present

## 2015-10-19 DIAGNOSIS — B1710 Acute hepatitis C without hepatic coma: Secondary | ICD-10-CM | POA: Diagnosis not present

## 2015-10-19 NOTE — BH Specialist Note (Signed)
Aneta Minshillip was present for his scheduled appointment today.  Patient was oriented times four with good affect and dress.  Patient was alert and talkative.  Patient appeared to be honest and upfront about his feelings and thoughts. Patient stated that he uses 1/2mg  to 1 mg of Soboxone daily or at least 4-5 times a week.  Patient said that he buys that along with some Xanax off the street.  Patient shared freely about his substance use as well as his depression.  Counselor educated patient about using substances instead of appropriate coping skills to deal with life's up's and down's. Counselor discussed with patient tips for developing better coping skills such as: diet, adequate sleep, exercise, and a strong and objective support network.  Counselor used reflective listening skills with patient as he described what a day in his life looked like.  Counselor communicated the need for counseling while being prescribed an anti depressant. Patient made another appointment for three weeks out.  Jenel LucksJodi Macari Zalesky, MA [facility]

## 2015-10-19 NOTE — Assessment & Plan Note (Signed)
Is going to see PCP, get started on rx.

## 2015-10-19 NOTE — Progress Notes (Signed)
   Subjective:    Patient ID: Ronald Huang, male    DOB: 1990-08-25, 25 y.o.   MRN: [id number]  HPI 25 yo M with newly dx with Hep C (3a,  VL 5.2 million, F0) and ADHD. Was seen at Urgent Care for STD screening panel.  Has been tapering off suboxone, still taking every 3-4 days.  Was seen in ID clinic on 3-17 and was given rx for epclusa. He has not started yet.  Is going to get PMD and get started on depression meds (this has been worse lately).   Review of Systems See HPI    Objective:   Physical Exam  Constitutional: He appears well-developed and well-nourished.  HENT:  Mouth/Throat: No oropharyngeal exudate.  Eyes: Pupils are equal, round, and reactive to light. No scleral icterus.  Cardiovascular: Normal rate, regular rhythm and normal heart sounds.   Pulmonary/Chest: Effort normal and breath sounds normal.  Abdominal: Soft. Bowel sounds are normal. He exhibits no mass. There is no tenderness. There is no rebound.      Assessment & Plan:

## 2015-10-19 NOTE — Assessment & Plan Note (Signed)
Is working with pharm to get rx started.  Will see him back in 8 weeks. Repeat VL then.  Is Hep B immune.

## 2015-10-25 ENCOUNTER — Telehealth: Payer: Self-pay | Admitting: Infectious Diseases

## 2015-10-25 NOTE — Telephone Encounter (Addendum)
Patient left two voicemails on the medication assistance line while I was out. Says he is supposed to report starting epclusa (started on the 8th). I tried to call patient back but his vm has not been set up yet.

## 2015-10-25 NOTE — Telephone Encounter (Signed)
He has gotten it already. Looks like it was delivered last Friday

## 2015-10-25 NOTE — Telephone Encounter (Signed)
Minh I believe this is for you

## 2015-10-26 ENCOUNTER — Encounter: Payer: Self-pay | Admitting: Pharmacy Technician

## 2015-11-17 ENCOUNTER — Ambulatory Visit: Payer: BLUE CROSS/BLUE SHIELD | Admitting: *Deleted

## 2015-11-17 ENCOUNTER — Ambulatory Visit: Payer: BLUE CROSS/BLUE SHIELD

## 2015-11-18 ENCOUNTER — Ambulatory Visit: Payer: BLUE CROSS/BLUE SHIELD | Admitting: *Deleted

## 2015-11-18 DIAGNOSIS — F1110 Opioid abuse, uncomplicated: Secondary | ICD-10-CM

## 2015-11-18 NOTE — BH Specialist Note (Signed)
Ronald Huang missed his appointment yesterday but showed today and communicated that he had the dates mixed up.  Patient was in good spirits.  Patient was oriented times four with good affect and dress.  Patient was alert and talkative.  Patient shared that he has not used since the last time he met with counselor and had gone to the doctor and been placed on an anti depressant.  Patient indicated that he felt the anti depressant was working very well because he had not been sad, has been able to resist using, got a promotion at work and is making a little more money and just over all feels good about himself.  Counselor discussed with patient about his pattern for relapsing.  Counselor work with patient on prevention and how to avoid relapse.  Counselor provided support and encouragement to patient.  Counselor shared with patient that he is really working hard and making wise decision lately. Counselor encouraged patient to consider a support group and outpatient substance abuse classes for after care purposes. Patient said he would think about it.  Rolena Infante, MA, LPC [facility]

## 2015-12-01 ENCOUNTER — Ambulatory Visit: Payer: BLUE CROSS/BLUE SHIELD | Admitting: Infectious Diseases

## 2015-12-07 ENCOUNTER — Ambulatory Visit: Payer: BLUE CROSS/BLUE SHIELD

## 2015-12-07 ENCOUNTER — Ambulatory Visit: Payer: BLUE CROSS/BLUE SHIELD | Admitting: Infectious Diseases

## 2015-12-16 ENCOUNTER — Ambulatory Visit: Payer: BLUE CROSS/BLUE SHIELD | Admitting: *Deleted
